# Patient Record
Sex: Female | Born: 1950 | Race: White | Hispanic: No | Marital: Married | Smoking: Never smoker
Health system: Southern US, Community
[De-identification: ages and names within clinical notes are randomized; demographics above are authoritative.]

## PROBLEM LIST (undated history)

## (undated) DIAGNOSIS — F419 Anxiety disorder, unspecified: Secondary | ICD-10-CM

## (undated) DIAGNOSIS — C801 Malignant (primary) neoplasm, unspecified: Secondary | ICD-10-CM

## (undated) DIAGNOSIS — E7800 Pure hypercholesterolemia, unspecified: Secondary | ICD-10-CM

## (undated) DIAGNOSIS — I10 Essential (primary) hypertension: Secondary | ICD-10-CM

## (undated) HISTORY — DX: Anxiety disorder, unspecified: F41.9

## (undated) HISTORY — PX: TRIGGER FINGER RELEASE: SHX641

## (undated) HISTORY — PX: ABDOMINAL HYSTERECTOMY: SHX81

## (undated) HISTORY — DX: Malignant (primary) neoplasm, unspecified: C80.1

---

## 1998-09-18 ENCOUNTER — Ambulatory Visit (HOSPITAL_COMMUNITY): Admission: RE | Admit: 1998-09-18 | Discharge: 1998-09-18 | Payer: Self-pay | Admitting: Obstetrics and Gynecology

## 1998-09-18 ENCOUNTER — Encounter: Payer: Self-pay | Admitting: Obstetrics and Gynecology

## 1998-10-06 ENCOUNTER — Other Ambulatory Visit: Admission: RE | Admit: 1998-10-06 | Discharge: 1998-10-06 | Payer: Self-pay | Admitting: Obstetrics and Gynecology

## 2000-01-31 ENCOUNTER — Encounter: Payer: Self-pay | Admitting: Obstetrics and Gynecology

## 2000-01-31 ENCOUNTER — Ambulatory Visit (HOSPITAL_COMMUNITY): Admission: RE | Admit: 2000-01-31 | Discharge: 2000-01-31 | Payer: Self-pay | Admitting: Obstetrics and Gynecology

## 2000-10-13 ENCOUNTER — Other Ambulatory Visit: Admission: RE | Admit: 2000-10-13 | Discharge: 2000-10-13 | Payer: Self-pay | Admitting: Obstetrics and Gynecology

## 2001-04-24 ENCOUNTER — Encounter: Payer: Self-pay | Admitting: Obstetrics and Gynecology

## 2001-04-24 ENCOUNTER — Ambulatory Visit (HOSPITAL_COMMUNITY): Admission: RE | Admit: 2001-04-24 | Discharge: 2001-04-24 | Payer: Self-pay | Admitting: Obstetrics and Gynecology

## 2001-05-07 ENCOUNTER — Encounter: Payer: Self-pay | Admitting: Obstetrics and Gynecology

## 2001-05-07 ENCOUNTER — Encounter: Admission: RE | Admit: 2001-05-07 | Discharge: 2001-05-07 | Payer: Self-pay | Admitting: Obstetrics and Gynecology

## 2002-08-13 ENCOUNTER — Encounter: Payer: Self-pay | Admitting: Obstetrics and Gynecology

## 2002-08-13 ENCOUNTER — Encounter: Admission: RE | Admit: 2002-08-13 | Discharge: 2002-08-13 | Payer: Self-pay | Admitting: Obstetrics and Gynecology

## 2003-08-18 ENCOUNTER — Encounter: Payer: Self-pay | Admitting: Obstetrics and Gynecology

## 2003-08-18 ENCOUNTER — Encounter: Admission: RE | Admit: 2003-08-18 | Discharge: 2003-08-18 | Payer: Self-pay | Admitting: Obstetrics and Gynecology

## 2004-09-17 ENCOUNTER — Ambulatory Visit: Payer: Self-pay | Admitting: Family Medicine

## 2004-09-21 ENCOUNTER — Ambulatory Visit: Payer: Self-pay | Admitting: Family Medicine

## 2004-09-24 ENCOUNTER — Encounter: Admission: RE | Admit: 2004-09-24 | Discharge: 2004-09-24 | Payer: Self-pay | Admitting: Obstetrics and Gynecology

## 2004-10-19 ENCOUNTER — Ambulatory Visit: Payer: Self-pay | Admitting: Family Medicine

## 2005-06-14 ENCOUNTER — Ambulatory Visit: Payer: Self-pay | Admitting: Family Medicine

## 2005-10-07 ENCOUNTER — Encounter: Admission: RE | Admit: 2005-10-07 | Discharge: 2005-10-07 | Payer: Self-pay | Admitting: Obstetrics and Gynecology

## 2005-10-14 ENCOUNTER — Ambulatory Visit: Payer: Self-pay | Admitting: Family Medicine

## 2005-10-28 ENCOUNTER — Ambulatory Visit: Payer: Self-pay | Admitting: Family Medicine

## 2005-11-25 ENCOUNTER — Encounter: Admission: RE | Admit: 2005-11-25 | Discharge: 2005-11-25 | Payer: Self-pay | Admitting: Obstetrics and Gynecology

## 2006-07-21 ENCOUNTER — Ambulatory Visit: Payer: Self-pay | Admitting: Family Medicine

## 2007-01-12 ENCOUNTER — Ambulatory Visit: Payer: Self-pay | Admitting: Family Medicine

## 2007-01-12 LAB — CONVERTED CEMR LAB
ALT: 23 units/L (ref 0–40)
AST: 26 units/L (ref 0–37)
Alkaline Phosphatase: 27 units/L — ABNORMAL LOW (ref 39–117)
BUN: 17 mg/dL (ref 6–23)
Bilirubin, Direct: 0.2 mg/dL (ref 0.0–0.3)
CO2: 27 meq/L (ref 19–32)
Calcium: 9.7 mg/dL (ref 8.4–10.5)
Chloride: 105 meq/L (ref 96–112)
GFR calc Af Amer: 134 mL/min
Glucose, Bld: 111 mg/dL — ABNORMAL HIGH (ref 70–99)
Total Protein: 6.7 g/dL (ref 6.0–8.3)

## 2007-01-15 ENCOUNTER — Encounter: Payer: Self-pay | Admitting: Family Medicine

## 2007-02-09 ENCOUNTER — Encounter: Admission: RE | Admit: 2007-02-09 | Discharge: 2007-05-10 | Payer: Self-pay | Admitting: Family Medicine

## 2007-03-23 ENCOUNTER — Encounter: Admission: RE | Admit: 2007-03-23 | Discharge: 2007-03-23 | Payer: Self-pay | Admitting: Obstetrics and Gynecology

## 2007-10-19 ENCOUNTER — Telehealth (INDEPENDENT_AMBULATORY_CARE_PROVIDER_SITE_OTHER): Payer: Self-pay | Admitting: *Deleted

## 2007-12-12 ENCOUNTER — Telehealth (INDEPENDENT_AMBULATORY_CARE_PROVIDER_SITE_OTHER): Payer: Self-pay | Admitting: *Deleted

## 2007-12-14 ENCOUNTER — Ambulatory Visit: Payer: Self-pay | Admitting: Family Medicine

## 2007-12-18 ENCOUNTER — Encounter: Payer: Self-pay | Admitting: Family Medicine

## 2007-12-23 LAB — CONVERTED CEMR LAB
ALT: 28 units/L (ref 0–35)
Alkaline Phosphatase: 32 units/L — ABNORMAL LOW (ref 39–117)
BUN: 12 mg/dL (ref 6–23)
Bilirubin, Direct: 0.2 mg/dL (ref 0.0–0.3)
CO2: 30 meq/L (ref 19–32)
Calcium: 9.5 mg/dL (ref 8.4–10.5)
Creatinine, Ser: 0.8 mg/dL (ref 0.4–1.2)
GFR calc Af Amer: 96 mL/min
Glucose, Bld: 108 mg/dL — ABNORMAL HIGH (ref 70–99)
Potassium: 3.8 meq/L (ref 3.5–5.1)
Total Bilirubin: 1.3 mg/dL — ABNORMAL HIGH (ref 0.3–1.2)
Total Protein: 6.9 g/dL (ref 6.0–8.3)

## 2007-12-26 ENCOUNTER — Telehealth (INDEPENDENT_AMBULATORY_CARE_PROVIDER_SITE_OTHER): Payer: Self-pay | Admitting: *Deleted

## 2008-01-28 ENCOUNTER — Telehealth (INDEPENDENT_AMBULATORY_CARE_PROVIDER_SITE_OTHER): Payer: Self-pay | Admitting: *Deleted

## 2008-08-15 ENCOUNTER — Encounter: Admission: RE | Admit: 2008-08-15 | Discharge: 2008-08-15 | Payer: Self-pay | Admitting: Family Medicine

## 2008-08-25 ENCOUNTER — Ambulatory Visit: Payer: Self-pay | Admitting: Gastroenterology

## 2008-09-08 ENCOUNTER — Ambulatory Visit: Payer: Self-pay | Admitting: Gastroenterology

## 2009-10-09 ENCOUNTER — Encounter: Admission: RE | Admit: 2009-10-09 | Discharge: 2009-10-09 | Payer: Self-pay | Admitting: Family Medicine

## 2010-10-25 ENCOUNTER — Encounter
Admission: RE | Admit: 2010-10-25 | Discharge: 2010-10-25 | Payer: Self-pay | Source: Home / Self Care | Attending: Family Medicine | Admitting: Family Medicine

## 2011-07-22 ENCOUNTER — Other Ambulatory Visit: Payer: Self-pay | Admitting: Dermatology

## 2011-10-03 ENCOUNTER — Other Ambulatory Visit: Payer: Self-pay | Admitting: Family Medicine

## 2011-10-03 DIAGNOSIS — Z1231 Encounter for screening mammogram for malignant neoplasm of breast: Secondary | ICD-10-CM

## 2011-10-27 ENCOUNTER — Ambulatory Visit
Admission: RE | Admit: 2011-10-27 | Discharge: 2011-10-27 | Disposition: A | Discharge status: Home / Self Care | Payer: 59 | Source: Ambulatory Visit | Attending: Family Medicine | Admitting: Family Medicine

## 2011-10-27 DIAGNOSIS — Z1231 Encounter for screening mammogram for malignant neoplasm of breast: Secondary | ICD-10-CM

## 2013-01-04 ENCOUNTER — Other Ambulatory Visit: Payer: Self-pay

## 2013-01-04 DIAGNOSIS — Z1231 Encounter for screening mammogram for malignant neoplasm of breast: Secondary | ICD-10-CM

## 2013-02-01 ENCOUNTER — Ambulatory Visit: Payer: 59

## 2013-03-04 ENCOUNTER — Ambulatory Visit
Admission: RE | Admit: 2013-03-04 | Discharge: 2013-03-04 | Disposition: A | Discharge status: Home / Self Care | Payer: BC Managed Care – PPO | Source: Ambulatory Visit

## 2013-03-04 DIAGNOSIS — Z1231 Encounter for screening mammogram for malignant neoplasm of breast: Secondary | ICD-10-CM

## 2013-06-27 ENCOUNTER — Encounter: Payer: Self-pay | Admitting: Gastroenterology

## 2013-08-16 ENCOUNTER — Encounter: Payer: Self-pay | Admitting: Gastroenterology

## 2013-10-24 ENCOUNTER — Encounter: Payer: BC Managed Care – PPO | Admitting: Gastroenterology

## 2014-02-21 ENCOUNTER — Other Ambulatory Visit: Payer: Self-pay

## 2014-02-21 DIAGNOSIS — Z1231 Encounter for screening mammogram for malignant neoplasm of breast: Secondary | ICD-10-CM

## 2014-03-10 ENCOUNTER — Ambulatory Visit: Payer: BC Managed Care – PPO

## 2014-03-14 ENCOUNTER — Ambulatory Visit
Admission: RE | Admit: 2014-03-14 | Discharge: 2014-03-14 | Disposition: A | Discharge status: Home / Self Care | Payer: BC Managed Care – PPO | Source: Ambulatory Visit

## 2014-03-14 DIAGNOSIS — Z1231 Encounter for screening mammogram for malignant neoplasm of breast: Secondary | ICD-10-CM

## 2014-04-05 ENCOUNTER — Encounter: Payer: Self-pay | Admitting: Gastroenterology

## 2015-02-24 ENCOUNTER — Other Ambulatory Visit: Payer: Self-pay

## 2015-02-24 DIAGNOSIS — Z1231 Encounter for screening mammogram for malignant neoplasm of breast: Secondary | ICD-10-CM

## 2015-03-16 ENCOUNTER — Ambulatory Visit: Payer: Self-pay

## 2015-04-24 ENCOUNTER — Encounter: Payer: Self-pay | Admitting: Gastroenterology

## 2015-05-29 ENCOUNTER — Ambulatory Visit
Admission: RE | Admit: 2015-05-29 | Discharge: 2015-05-29 | Disposition: A | Discharge status: Home / Self Care | Payer: BLUE CROSS/BLUE SHIELD | Source: Ambulatory Visit

## 2015-05-29 DIAGNOSIS — Z1231 Encounter for screening mammogram for malignant neoplasm of breast: Secondary | ICD-10-CM

## 2016-08-31 ENCOUNTER — Other Ambulatory Visit: Payer: Self-pay | Admitting: Family Medicine

## 2016-08-31 DIAGNOSIS — Z1231 Encounter for screening mammogram for malignant neoplasm of breast: Secondary | ICD-10-CM

## 2016-09-23 ENCOUNTER — Ambulatory Visit
Admission: RE | Admit: 2016-09-23 | Discharge: 2016-09-23 | Disposition: A | Discharge status: Home / Self Care | Payer: 59 | Source: Ambulatory Visit | Attending: Family Medicine | Admitting: Family Medicine

## 2016-09-23 DIAGNOSIS — Z1231 Encounter for screening mammogram for malignant neoplasm of breast: Secondary | ICD-10-CM

## 2017-01-23 DIAGNOSIS — D225 Melanocytic nevi of trunk: Secondary | ICD-10-CM | POA: Diagnosis not present

## 2017-01-23 DIAGNOSIS — D2372 Other benign neoplasm of skin of left lower limb, including hip: Secondary | ICD-10-CM | POA: Diagnosis not present

## 2017-01-23 DIAGNOSIS — Z86008 Personal history of in-situ neoplasm of other site: Secondary | ICD-10-CM | POA: Diagnosis not present

## 2017-01-23 DIAGNOSIS — L905 Scar conditions and fibrosis of skin: Secondary | ICD-10-CM | POA: Diagnosis not present

## 2017-01-23 DIAGNOSIS — Z23 Encounter for immunization: Secondary | ICD-10-CM | POA: Diagnosis not present

## 2017-01-23 DIAGNOSIS — D2272 Melanocytic nevi of left lower limb, including hip: Secondary | ICD-10-CM | POA: Diagnosis not present

## 2017-01-23 DIAGNOSIS — D224 Melanocytic nevi of scalp and neck: Secondary | ICD-10-CM | POA: Diagnosis not present

## 2017-01-23 DIAGNOSIS — Z86018 Personal history of other benign neoplasm: Secondary | ICD-10-CM | POA: Diagnosis not present

## 2017-01-23 DIAGNOSIS — L821 Other seborrheic keratosis: Secondary | ICD-10-CM | POA: Diagnosis not present

## 2017-01-23 DIAGNOSIS — L814 Other melanin hyperpigmentation: Secondary | ICD-10-CM | POA: Diagnosis not present

## 2017-02-24 DIAGNOSIS — D3132 Benign neoplasm of left choroid: Secondary | ICD-10-CM | POA: Diagnosis not present

## 2017-02-24 DIAGNOSIS — H40013 Open angle with borderline findings, low risk, bilateral: Secondary | ICD-10-CM | POA: Diagnosis not present

## 2017-07-11 DIAGNOSIS — R7309 Other abnormal glucose: Secondary | ICD-10-CM | POA: Diagnosis not present

## 2017-07-11 DIAGNOSIS — Z8679 Personal history of other diseases of the circulatory system: Secondary | ICD-10-CM | POA: Diagnosis not present

## 2017-07-11 DIAGNOSIS — E782 Mixed hyperlipidemia: Secondary | ICD-10-CM | POA: Diagnosis not present

## 2017-07-11 DIAGNOSIS — R0789 Other chest pain: Secondary | ICD-10-CM | POA: Diagnosis not present

## 2017-07-11 DIAGNOSIS — Z8639 Personal history of other endocrine, nutritional and metabolic disease: Secondary | ICD-10-CM | POA: Diagnosis not present

## 2017-07-14 DIAGNOSIS — D225 Melanocytic nevi of trunk: Secondary | ICD-10-CM | POA: Diagnosis not present

## 2017-07-14 DIAGNOSIS — R0789 Other chest pain: Secondary | ICD-10-CM | POA: Diagnosis not present

## 2017-07-14 DIAGNOSIS — E782 Mixed hyperlipidemia: Secondary | ICD-10-CM | POA: Diagnosis not present

## 2017-07-14 DIAGNOSIS — L814 Other melanin hyperpigmentation: Secondary | ICD-10-CM | POA: Diagnosis not present

## 2017-07-14 DIAGNOSIS — D2372 Other benign neoplasm of skin of left lower limb, including hip: Secondary | ICD-10-CM | POA: Diagnosis not present

## 2017-07-14 DIAGNOSIS — I10 Essential (primary) hypertension: Secondary | ICD-10-CM | POA: Diagnosis not present

## 2017-07-14 DIAGNOSIS — D2272 Melanocytic nevi of left lower limb, including hip: Secondary | ICD-10-CM | POA: Diagnosis not present

## 2017-07-14 DIAGNOSIS — D224 Melanocytic nevi of scalp and neck: Secondary | ICD-10-CM | POA: Diagnosis not present

## 2017-07-14 DIAGNOSIS — Z86018 Personal history of other benign neoplasm: Secondary | ICD-10-CM | POA: Diagnosis not present

## 2017-07-14 DIAGNOSIS — R7309 Other abnormal glucose: Secondary | ICD-10-CM | POA: Diagnosis not present

## 2017-07-14 DIAGNOSIS — Z23 Encounter for immunization: Secondary | ICD-10-CM | POA: Diagnosis not present

## 2017-07-14 DIAGNOSIS — L821 Other seborrheic keratosis: Secondary | ICD-10-CM | POA: Diagnosis not present

## 2017-07-14 DIAGNOSIS — B359 Dermatophytosis, unspecified: Secondary | ICD-10-CM | POA: Diagnosis not present

## 2017-07-14 DIAGNOSIS — Z86008 Personal history of in-situ neoplasm of other site: Secondary | ICD-10-CM | POA: Diagnosis not present

## 2017-08-07 DIAGNOSIS — Z23 Encounter for immunization: Secondary | ICD-10-CM | POA: Diagnosis not present

## 2017-08-14 DIAGNOSIS — E782 Mixed hyperlipidemia: Secondary | ICD-10-CM | POA: Diagnosis not present

## 2017-08-14 DIAGNOSIS — I10 Essential (primary) hypertension: Secondary | ICD-10-CM | POA: Diagnosis not present

## 2017-08-14 DIAGNOSIS — R0789 Other chest pain: Secondary | ICD-10-CM | POA: Diagnosis not present

## 2017-08-21 ENCOUNTER — Other Ambulatory Visit: Payer: Self-pay | Admitting: Family Medicine

## 2017-08-21 DIAGNOSIS — Z1231 Encounter for screening mammogram for malignant neoplasm of breast: Secondary | ICD-10-CM

## 2017-08-25 DIAGNOSIS — F419 Anxiety disorder, unspecified: Secondary | ICD-10-CM | POA: Diagnosis not present

## 2017-09-25 ENCOUNTER — Ambulatory Visit
Admission: RE | Admit: 2017-09-25 | Discharge: 2017-09-25 | Disposition: A | Discharge status: Home / Self Care | Payer: PPO | Source: Ambulatory Visit | Attending: Family Medicine | Admitting: Family Medicine

## 2017-09-25 ENCOUNTER — Encounter (INDEPENDENT_AMBULATORY_CARE_PROVIDER_SITE_OTHER): Payer: Self-pay

## 2017-09-25 DIAGNOSIS — Z1231 Encounter for screening mammogram for malignant neoplasm of breast: Secondary | ICD-10-CM | POA: Diagnosis not present

## 2017-10-02 DIAGNOSIS — J069 Acute upper respiratory infection, unspecified: Secondary | ICD-10-CM | POA: Diagnosis not present

## 2017-10-02 DIAGNOSIS — R05 Cough: Secondary | ICD-10-CM | POA: Diagnosis not present

## 2017-10-02 DIAGNOSIS — J040 Acute laryngitis: Secondary | ICD-10-CM | POA: Diagnosis not present

## 2017-12-26 ENCOUNTER — Emergency Department (HOSPITAL_BASED_OUTPATIENT_CLINIC_OR_DEPARTMENT_OTHER): Payer: PPO

## 2017-12-26 ENCOUNTER — Encounter (HOSPITAL_BASED_OUTPATIENT_CLINIC_OR_DEPARTMENT_OTHER): Payer: Self-pay

## 2017-12-26 ENCOUNTER — Other Ambulatory Visit: Payer: Self-pay

## 2017-12-26 ENCOUNTER — Emergency Department (HOSPITAL_BASED_OUTPATIENT_CLINIC_OR_DEPARTMENT_OTHER)
Admission: EM | Admit: 2017-12-26 | Discharge: 2017-12-26 | Disposition: A | Discharge status: Home / Self Care | Payer: PPO | Attending: Emergency Medicine | Admitting: Emergency Medicine

## 2017-12-26 DIAGNOSIS — W010XXA Fall on same level from slipping, tripping and stumbling without subsequent striking against object, initial encounter: Secondary | ICD-10-CM | POA: Diagnosis not present

## 2017-12-26 DIAGNOSIS — Y9389 Activity, other specified: Secondary | ICD-10-CM | POA: Insufficient documentation

## 2017-12-26 DIAGNOSIS — Z7982 Long term (current) use of aspirin: Secondary | ICD-10-CM | POA: Insufficient documentation

## 2017-12-26 DIAGNOSIS — S20211A Contusion of right front wall of thorax, initial encounter: Secondary | ICD-10-CM | POA: Diagnosis not present

## 2017-12-26 DIAGNOSIS — I10 Essential (primary) hypertension: Secondary | ICD-10-CM | POA: Insufficient documentation

## 2017-12-26 DIAGNOSIS — Y998 Other external cause status: Secondary | ICD-10-CM | POA: Diagnosis not present

## 2017-12-26 DIAGNOSIS — Y929 Unspecified place or not applicable: Secondary | ICD-10-CM | POA: Diagnosis not present

## 2017-12-26 DIAGNOSIS — R0781 Pleurodynia: Secondary | ICD-10-CM | POA: Diagnosis not present

## 2017-12-26 DIAGNOSIS — Z79899 Other long term (current) drug therapy: Secondary | ICD-10-CM | POA: Insufficient documentation

## 2017-12-26 DIAGNOSIS — S20221A Contusion of right back wall of thorax, initial encounter: Secondary | ICD-10-CM | POA: Diagnosis not present

## 2017-12-26 DIAGNOSIS — S299XXA Unspecified injury of thorax, initial encounter: Secondary | ICD-10-CM | POA: Diagnosis not present

## 2017-12-26 HISTORY — DX: Pure hypercholesterolemia, unspecified: E78.00

## 2017-12-26 HISTORY — DX: Essential (primary) hypertension: I10

## 2017-12-26 MED ORDER — IBUPROFEN 400 MG PO TABS: 400.0000 mg | ORAL_TABLET | Freq: Once | ORAL | Status: DC

## 2017-12-26 MED ORDER — TRAMADOL HCL 50 MG PO TABS: 50.0000 mg | ORAL_TABLET | Freq: Four times a day (QID) | ORAL | 0 refills | Status: DC | PRN

## 2017-12-26 MED ORDER — METHOCARBAMOL 750 MG PO TABS: 750.0000 mg | ORAL_TABLET | Freq: Three times a day (TID) | ORAL | 0 refills | Status: DC | PRN

## 2017-12-26 MED FILL — traMADol HCL 50 MG TABS: 50 | 3 days supply | Qty: 15 | Fill #0

## 2017-12-26 MED FILL — METHOCARBAMOL 750 MG TABLET: 750 | 5 days supply | Qty: 15 | Fill #0

## 2017-12-26 NOTE — ED Triage Notes (Signed)
Pt states she fell playing pickle ball 915am-c/o pain to right fingers, right elbow and right rib area-NAD-steady gait-pt requests rib only xray at this time

## 2017-12-26 NOTE — ED Notes (Signed)
ED Provider at bedside. 

## 2017-12-26 NOTE — ED Provider Notes (Signed)
[facility] EMERGENCY DEPARTMENT Provider Note   CSN: [id number] Arrival date & time: 12/26/17  1045     History   Chief Complaint Chief Complaint  Patient presents with  . Fall    HPI Julia Scott is a 67 y.o. female.  Patient c/o trip and fall while playing pickleball this AM, hit right posterior ribs on post. Constant, dull, moderate pain to area. Worse w certain movements and palpation area.  No sob. No abd pain. No nv. Denies other pain/injury. No head injury or headache. No neck or back pain.     Fall  Pertinent negatives include no headaches and no shortness of breath.    Past Medical History:  Diagnosis Date  . High cholesterol   . Hypertension     There are no active problems to display for this patient.   Past Surgical History:  Procedure Laterality Date  . ABDOMINAL HYSTERECTOMY      OB History    No data available       Home Medications    Prior to Admission medications   Medication Sig Start Date End Date Taking? Authorizing Provider  aspirin 81 MG chewable tablet Chew by mouth daily.   Yes [provider]  atenolol (TENORMIN) 50 MG tablet Take 50 mg by mouth daily.   Yes [provider]  fenofibrate 160 MG tablet Take 160 mg by mouth daily.   Yes [provider]  rosuvastatin (CRESTOR) 10 MG tablet Take 10 mg by mouth daily.   Yes [provider]    Family History No family history on file.  Social History Social History   Tobacco Use  . Smoking status: Never Smoker  . Smokeless tobacco: Never Used  Substance Use Topics  . Alcohol use: Yes    Comment: occ  . Drug use: No     Allergies   Patient has no known allergies.   Review of Systems Review of Systems  Constitutional: Negative for fever.  Respiratory: Negative for shortness of breath.   Musculoskeletal: Negative for back pain and neck pain.  Skin: Negative for wound.  Neurological: Negative for headaches.      Physical Exam Updated Vital Signs BP (!) 117/91 (BP Location: Left Arm)   Pulse (!) 54   Temp 98.5 F (36.9 C) (Oral)   Resp 20   Ht 1.727 m (5\' 8" )   Wt 84.4 kg (186 lb 1.1 oz)   SpO2 98%   BMI 28.29 kg/m   Physical Exam  Constitutional: She appears well-developed and well-nourished. No distress.  HENT:  Head: Atraumatic.  Eyes: Conjunctivae are normal. No scleral icterus.  Neck: Neck supple. No tracheal deviation present.  Pulmonary/Chest: Effort normal and breath sounds normal. No respiratory distress. She exhibits tenderness.  Right posterior/lat chest wall tenderness. Normal chest wall movement. No crepitus.   Abdominal: Soft. Normal appearance. She exhibits no distension. There is no tenderness.  Musculoskeletal: She exhibits no edema.  CTLS spine, non tender, aligned, no step off.   Neurological: She is alert.  Speech normal. Ambulates w steady gait.   Skin: Skin is warm and dry. No rash noted. She is not diaphoretic.  Psychiatric: She has a normal mood and affect.  Nursing note and vitals reviewed.    ED Treatments / Results  Labs (all labs ordered are listed, but only abnormal results are displayed) Labs Reviewed - No data to display  EKG  EKG Interpretation None       Radiology  Dg Ribs Unilateral W/chest Right  Result Date: 12/26/2017 CLINICAL DATA:  Fall.  Right rib pain EXAM: RIGHT RIBS AND CHEST - 3+ VIEW COMPARISON:  None. FINDINGS: No appreciable pneumothorax or pleural effusion. Thoracic spondylosis. Atherosclerotic calcification of the aortic arch. Calcification adjacent to the right humeral head. No pulmonary contusion. The lower ribs in the area of the patient's point of discomfort are indistinct. I do not see an obvious cortical discontinuity. IMPRESSION: 1. No appreciable displaced fracture. Nondisplaced rib fractures are often occult on conventional radiography. 2. No pneumothorax, pleural effusion, or pulmonary contusion. 3.  Aortic  Atherosclerosis (ICD10-I70.0). Electronically Signed   By: Van Clines M.D.   On: 12/26/2017 11:39    Procedures Procedures (including critical care time)  Medications Ordered in ED Medications  ibuprofen (ADVIL,MOTRIN) tablet 400 mg (not administered)     Initial Impression / Assessment and Plan / ED Course  I have reviewed the triage vital signs and the nursing notes.  Pertinent labs & imaging results that were available during my care of the patient were reviewed by me and considered in my medical decision making (see chart for details).  Xrays.  Motrin po.  xrays reviewed, no fracture.   Discussed xrays w pt and diff dx including bruised ribs, non displaced fx.   Pt request muscle relaxer rx for home.   Final Clinical Impressions(s) / ED Diagnoses   Final diagnoses:  None    ED Discharge Orders    None       Lajean Saver, MD 12/26/17 1329

## 2017-12-26 NOTE — Discharge Instructions (Signed)
It was our pleasure to provide your ER care today - we hope that you feel better.  Take motrin or aleve as need for pain.  You may also try ultram as need for pain, and/or robaxin as need for muscle spasm - no driving when taking these medications.  Return to ER if worse, new symptoms, trouble breathing, other concern.

## 2017-12-27 ENCOUNTER — Other Ambulatory Visit: Payer: Self-pay

## 2018-01-30 DIAGNOSIS — J40 Bronchitis, not specified as acute or chronic: Secondary | ICD-10-CM | POA: Diagnosis not present

## 2018-02-09 DIAGNOSIS — E782 Mixed hyperlipidemia: Secondary | ICD-10-CM | POA: Diagnosis not present

## 2018-02-09 DIAGNOSIS — F419 Anxiety disorder, unspecified: Secondary | ICD-10-CM | POA: Diagnosis not present

## 2018-02-09 DIAGNOSIS — I10 Essential (primary) hypertension: Secondary | ICD-10-CM | POA: Diagnosis not present

## 2018-05-31 DIAGNOSIS — M65342 Trigger finger, left ring finger: Secondary | ICD-10-CM | POA: Diagnosis not present

## 2018-05-31 DIAGNOSIS — M65332 Trigger finger, left middle finger: Secondary | ICD-10-CM | POA: Diagnosis not present

## 2018-05-31 DIAGNOSIS — M65341 Trigger finger, right ring finger: Secondary | ICD-10-CM | POA: Diagnosis not present

## 2018-06-22 DIAGNOSIS — M65342 Trigger finger, left ring finger: Secondary | ICD-10-CM | POA: Diagnosis not present

## 2018-06-22 DIAGNOSIS — M65332 Trigger finger, left middle finger: Secondary | ICD-10-CM | POA: Diagnosis not present

## 2018-07-16 DIAGNOSIS — H01022 Squamous blepharitis right lower eyelid: Secondary | ICD-10-CM | POA: Diagnosis not present

## 2018-07-16 DIAGNOSIS — H2513 Age-related nuclear cataract, bilateral: Secondary | ICD-10-CM | POA: Diagnosis not present

## 2018-07-16 DIAGNOSIS — H01024 Squamous blepharitis left upper eyelid: Secondary | ICD-10-CM | POA: Diagnosis not present

## 2018-07-16 DIAGNOSIS — H01021 Squamous blepharitis right upper eyelid: Secondary | ICD-10-CM | POA: Diagnosis not present

## 2018-07-16 DIAGNOSIS — H5212 Myopia, left eye: Secondary | ICD-10-CM | POA: Diagnosis not present

## 2018-07-16 DIAGNOSIS — H01025 Squamous blepharitis left lower eyelid: Secondary | ICD-10-CM | POA: Diagnosis not present

## 2018-07-16 DIAGNOSIS — H40013 Open angle with borderline findings, low risk, bilateral: Secondary | ICD-10-CM | POA: Diagnosis not present

## 2018-07-16 DIAGNOSIS — D3132 Benign neoplasm of left choroid: Secondary | ICD-10-CM | POA: Diagnosis not present

## 2018-07-19 DIAGNOSIS — Z86018 Personal history of other benign neoplasm: Secondary | ICD-10-CM | POA: Diagnosis not present

## 2018-07-19 DIAGNOSIS — D224 Melanocytic nevi of scalp and neck: Secondary | ICD-10-CM | POA: Diagnosis not present

## 2018-07-19 DIAGNOSIS — D485 Neoplasm of uncertain behavior of skin: Secondary | ICD-10-CM | POA: Diagnosis not present

## 2018-07-19 DIAGNOSIS — D2372 Other benign neoplasm of skin of left lower limb, including hip: Secondary | ICD-10-CM | POA: Diagnosis not present

## 2018-07-19 DIAGNOSIS — L814 Other melanin hyperpigmentation: Secondary | ICD-10-CM | POA: Diagnosis not present

## 2018-07-19 DIAGNOSIS — Z86008 Personal history of in-situ neoplasm of other site: Secondary | ICD-10-CM | POA: Diagnosis not present

## 2018-07-19 DIAGNOSIS — D2272 Melanocytic nevi of left lower limb, including hip: Secondary | ICD-10-CM | POA: Diagnosis not present

## 2018-07-19 DIAGNOSIS — D225 Melanocytic nevi of trunk: Secondary | ICD-10-CM | POA: Diagnosis not present

## 2018-09-11 ENCOUNTER — Encounter: Payer: Self-pay | Admitting: Gastroenterology

## 2018-10-11 ENCOUNTER — Encounter (INDEPENDENT_AMBULATORY_CARE_PROVIDER_SITE_OTHER): Payer: Self-pay

## 2018-10-11 ENCOUNTER — Encounter: Payer: Self-pay | Admitting: Gastroenterology

## 2018-10-11 ENCOUNTER — Ambulatory Visit (AMBULATORY_SURGERY_CENTER): Payer: Self-pay

## 2018-10-11 VITALS — Ht 68.0 in | Wt 197.6 lb

## 2018-10-11 DIAGNOSIS — Z1211 Encounter for screening for malignant neoplasm of colon: Secondary | ICD-10-CM

## 2018-10-11 MED ORDER — PEG 3350-KCL-NA BICARB-NACL 420 G PO SOLR: 4000.0000 mL | Freq: Once | ORAL | 0 refills | Status: AC

## 2018-10-11 NOTE — Progress Notes (Signed)
Denies allergies to eggs or soy products. Denies complication of anesthesia or sedation. Denies use of weight loss medication. Denies use of O2.   Emmi instructions declined.  

## 2018-10-18 DIAGNOSIS — F419 Anxiety disorder, unspecified: Secondary | ICD-10-CM | POA: Diagnosis not present

## 2018-10-18 DIAGNOSIS — E782 Mixed hyperlipidemia: Secondary | ICD-10-CM | POA: Diagnosis not present

## 2018-10-18 DIAGNOSIS — Z23 Encounter for immunization: Secondary | ICD-10-CM | POA: Diagnosis not present

## 2018-10-18 DIAGNOSIS — I10 Essential (primary) hypertension: Secondary | ICD-10-CM | POA: Diagnosis not present

## 2018-10-25 ENCOUNTER — Ambulatory Visit (AMBULATORY_SURGERY_CENTER): Payer: PPO | Admitting: Gastroenterology

## 2018-10-25 ENCOUNTER — Encounter: Payer: Self-pay | Admitting: Gastroenterology

## 2018-10-25 VITALS — BP 149/78 | HR 49 | Temp 97.3°F | Resp 14 | Ht 68.0 in | Wt 197.0 lb

## 2018-10-25 DIAGNOSIS — Z86010 Personal history of colonic polyps: Secondary | ICD-10-CM | POA: Diagnosis not present

## 2018-10-25 DIAGNOSIS — E7800 Pure hypercholesterolemia, unspecified: Secondary | ICD-10-CM | POA: Diagnosis not present

## 2018-10-25 DIAGNOSIS — I10 Essential (primary) hypertension: Secondary | ICD-10-CM | POA: Diagnosis not present

## 2018-10-25 MED ORDER — SODIUM CHLORIDE 0.9 % IV SOLN: 500.0000 mL | Freq: Once | INTRAVENOUS | Status: DC

## 2018-10-25 NOTE — Progress Notes (Signed)
Pt's states no medical or surgical changes since previsit or office visit. 

## 2018-10-25 NOTE — Patient Instructions (Signed)
Handouts given for high fiber diet and diverticulosis  YOU HAD AN ENDOSCOPIC PROCEDURE TODAY AT [facility]:   Refer to the procedure report that was given to you for any specific questions about what was found during the examination.  If the procedure report does not answer your questions, please call your gastroenterologist to clarify.  If you requested that your care partner not be given the details of your procedure findings, then the procedure report has been included in a sealed envelope for you to review at your convenience later.  YOU SHOULD EXPECT: Some feelings of bloating in the abdomen. Passage of more gas than usual.  Walking can help get rid of the air that was put into your GI tract during the procedure and reduce the bloating. If you had a lower endoscopy (such as a colonoscopy or flexible sigmoidoscopy) you may notice spotting of blood in your stool or on the toilet paper. If you underwent a bowel prep for your procedure, you may not have a normal bowel movement for a few days.  Please Note:  You might notice some irritation and congestion in your nose or some drainage.  This is from the oxygen used during your procedure.  There is no need for concern and it should clear up in a day or so.  SYMPTOMS TO REPORT IMMEDIATELY:   Following lower endoscopy (colonoscopy or flexible sigmoidoscopy):  Excessive amounts of blood in the stool  Significant tenderness or worsening of abdominal pains  Swelling of the abdomen that is new, acute  Fever of 100F or higher  For urgent or emergent issues, a gastroenterologist can be reached at any hour by calling [phone].   DIET:  We do recommend a small meal at first, but then you may proceed to your regular diet.  Drink plenty of fluids but you should avoid alcoholic beverages for 24 hours.  ACTIVITY:  You should plan to take it easy for the rest of today and you should NOT DRIVE or use heavy machinery until tomorrow  (because of the sedation medicines used during the test).    FOLLOW UP: Our staff will call the number listed on your records the next business day following your procedure to check on you and address any questions or concerns that you may have regarding the information given to you following your procedure. If we do not reach you, we will leave a message.  However, if you are feeling well and you are not experiencing any problems, there is no need to return our call.  We will assume that you have returned to your regular daily activities without incident.  If any biopsies were taken you will be contacted by phone or by letter within the next 1-3 weeks.  Please call us at [phone] if you have not heard about the biopsies in 3 weeks.    SIGNATURES/CONFIDENTIALITY: You and/or your care partner have signed paperwork which will be entered into your electronic medical record.  These signatures attest to the fact that that the information above on your After Visit Summary has been reviewed and is understood.  Full responsibility of the confidentiality of this discharge information lies with you and/or your care-partner.

## 2018-10-25 NOTE — Progress Notes (Signed)
Report given to PACU, vss 

## 2018-10-25 NOTE — Op Note (Signed)
[facility] Patient Name: Julia Scott Procedure Date: 10/25/2018 8:02 AM MRN: [id number] Endoscopist: Ladene Artist , MD Age: 67 Referring MD:  Date of Birth: 08-05-1951 Gender: Female Account #: [id number] Procedure:                Colonoscopy Indications:              Surveillance: Personal history of adenomatous                            polyps on last colonoscopy 5 years ago Medicines:                Monitored Anesthesia Care Procedure:                Pre-Anesthesia Assessment:                           - Prior to the procedure, a History and Physical                            was performed, and patient medications and                            allergies were reviewed. The patient's tolerance of                            previous anesthesia was also reviewed. The risks                            and benefits of the procedure and the sedation                            options and risks were discussed with the patient.                            All questions were answered, and informed consent                            was obtained. Prior Anticoagulants: The patient has                            taken no previous anticoagulant or antiplatelet                            agents. ASA Grade Assessment: II - A patient with                            mild systemic disease. After reviewing the risks                            and benefits, the patient was deemed in                            satisfactory condition to undergo the procedure.  After obtaining informed consent, the colonoscope                            was passed under direct vision. Throughout the                            procedure, the patient's blood pressure, pulse, and                            oxygen saturations were monitored continuously. The                            Colonoscope was introduced through the anus and                            advanced to the the  cecum, identified by                            appendiceal orifice and ileocecal valve. The                            ileocecal valve, appendiceal orifice, and rectum                            were photographed. The quality of the bowel                            preparation was adequate. The colonoscopy was                            performed without difficulty. The patient tolerated                            the procedure well. Scope In: 8:11:14 AM Scope Out: 8:25:26 AM Scope Withdrawal Time: 0 hours 11 minutes 46 seconds  Total Procedure Duration: 0 hours 14 minutes 12 seconds  Findings:                 The perianal and digital rectal examinations were                            normal.                           Multiple medium-mouthed diverticula were found in                            the entire colon. More concentrated in the left                            colon where there was evidence of diverticular                            spasm.  The exam was otherwise without abnormality on                            direct and retroflexion views. Complications:            No immediate complications. Estimated blood loss:                            None. Estimated Blood Loss:     Estimated blood loss: none. Impression:               - Moderate diverticulosis in the entire examined                            colon. There was evidence of diverticular spasm.                           - The examination was otherwise normal on direct                            and retroflexion views.                           - No specimens collected. Recommendation:           - Repeat colonoscopy in 10 years for screening                            purposes.                           - Patient has a contact number available for                            emergencies. The signs and symptoms of potential                            delayed complications were discussed with the                             patient. Return to normal activities tomorrow.                            Written discharge instructions were provided to the                            patient.                           - High fiber diet.                           - Continue present medications. Ladene Artist, MD 10/25/2018 8:29:36 AM This report has been signed electronically.

## 2018-10-26 ENCOUNTER — Telehealth: Payer: Self-pay

## 2018-10-26 NOTE — Telephone Encounter (Signed)
  Follow up Call-  Call back number 10/25/2018  Post procedure Call Back phone  # [phone]  Permission to leave phone message Yes  Some recent data might be hidden     Patient questions:  Do you have a fever, pain , or abdominal swelling? No. Pain Score  0 *  Have you tolerated food without any problems? Yes.    Have you been able to return to your normal activities? Yes.    Do you have any questions about your discharge instructions: Diet   No. Medications  No. Follow up visit  No.  Do you have questions or concerns about your Care? No.  Actions: * If pain score is 4 or above: No action needed, pain <4.

## 2018-11-01 DIAGNOSIS — J9801 Acute bronchospasm: Secondary | ICD-10-CM | POA: Diagnosis not present

## 2018-11-01 DIAGNOSIS — J209 Acute bronchitis, unspecified: Secondary | ICD-10-CM | POA: Diagnosis not present

## 2018-11-02 ENCOUNTER — Other Ambulatory Visit: Payer: Self-pay | Admitting: Family Medicine

## 2018-11-02 DIAGNOSIS — Z1231 Encounter for screening mammogram for malignant neoplasm of breast: Secondary | ICD-10-CM

## 2018-11-27 ENCOUNTER — Ambulatory Visit
Admission: RE | Admit: 2018-11-27 | Discharge: 2018-11-27 | Disposition: A | Discharge status: Home / Self Care | Payer: PPO | Source: Ambulatory Visit | Attending: Family Medicine | Admitting: Family Medicine

## 2018-11-27 DIAGNOSIS — Z1231 Encounter for screening mammogram for malignant neoplasm of breast: Secondary | ICD-10-CM

## 2019-05-02 DIAGNOSIS — B353 Tinea pedis: Secondary | ICD-10-CM | POA: Diagnosis not present

## 2019-05-02 DIAGNOSIS — L814 Other melanin hyperpigmentation: Secondary | ICD-10-CM | POA: Diagnosis not present

## 2019-05-02 DIAGNOSIS — D2272 Melanocytic nevi of left lower limb, including hip: Secondary | ICD-10-CM | POA: Diagnosis not present

## 2019-05-02 DIAGNOSIS — D224 Melanocytic nevi of scalp and neck: Secondary | ICD-10-CM | POA: Diagnosis not present

## 2019-05-02 DIAGNOSIS — Z86018 Personal history of other benign neoplasm: Secondary | ICD-10-CM | POA: Diagnosis not present

## 2019-05-02 DIAGNOSIS — L570 Actinic keratosis: Secondary | ICD-10-CM | POA: Diagnosis not present

## 2019-05-02 DIAGNOSIS — D2372 Other benign neoplasm of skin of left lower limb, including hip: Secondary | ICD-10-CM | POA: Diagnosis not present

## 2019-05-02 DIAGNOSIS — D225 Melanocytic nevi of trunk: Secondary | ICD-10-CM | POA: Diagnosis not present

## 2019-05-02 DIAGNOSIS — Z86008 Personal history of in-situ neoplasm of other site: Secondary | ICD-10-CM | POA: Diagnosis not present

## 2019-05-27 DIAGNOSIS — R110 Nausea: Secondary | ICD-10-CM | POA: Diagnosis not present

## 2019-05-27 DIAGNOSIS — R1013 Epigastric pain: Secondary | ICD-10-CM | POA: Diagnosis not present

## 2019-05-27 DIAGNOSIS — I10 Essential (primary) hypertension: Secondary | ICD-10-CM | POA: Diagnosis not present

## 2019-05-27 DIAGNOSIS — E782 Mixed hyperlipidemia: Secondary | ICD-10-CM | POA: Diagnosis not present

## 2019-09-05 DIAGNOSIS — M65342 Trigger finger, left ring finger: Secondary | ICD-10-CM | POA: Diagnosis not present

## 2019-09-05 DIAGNOSIS — M65341 Trigger finger, right ring finger: Secondary | ICD-10-CM | POA: Diagnosis not present

## 2019-09-05 DIAGNOSIS — M65332 Trigger finger, left middle finger: Secondary | ICD-10-CM | POA: Diagnosis not present

## 2019-09-05 DIAGNOSIS — M65312 Trigger thumb, left thumb: Secondary | ICD-10-CM | POA: Diagnosis not present

## 2019-09-18 DIAGNOSIS — M65312 Trigger thumb, left thumb: Secondary | ICD-10-CM | POA: Diagnosis not present

## 2019-12-03 ENCOUNTER — Ambulatory Visit: Payer: PPO

## 2019-12-04 DIAGNOSIS — E782 Mixed hyperlipidemia: Secondary | ICD-10-CM | POA: Diagnosis not present

## 2019-12-04 DIAGNOSIS — I10 Essential (primary) hypertension: Secondary | ICD-10-CM | POA: Diagnosis not present

## 2019-12-04 DIAGNOSIS — F419 Anxiety disorder, unspecified: Secondary | ICD-10-CM | POA: Diagnosis not present

## 2019-12-12 ENCOUNTER — Ambulatory Visit: Payer: PPO

## 2019-12-20 ENCOUNTER — Ambulatory Visit: Payer: PPO

## 2020-01-08 ENCOUNTER — Other Ambulatory Visit: Payer: Self-pay | Admitting: Family Medicine

## 2020-01-08 DIAGNOSIS — Z1231 Encounter for screening mammogram for malignant neoplasm of breast: Secondary | ICD-10-CM

## 2020-01-09 ENCOUNTER — Ambulatory Visit: Payer: PPO

## 2020-01-09 DIAGNOSIS — I10 Essential (primary) hypertension: Secondary | ICD-10-CM | POA: Diagnosis not present

## 2020-01-09 DIAGNOSIS — R7309 Other abnormal glucose: Secondary | ICD-10-CM | POA: Diagnosis not present

## 2020-01-09 DIAGNOSIS — Z79899 Other long term (current) drug therapy: Secondary | ICD-10-CM | POA: Diagnosis not present

## 2020-01-09 DIAGNOSIS — E782 Mixed hyperlipidemia: Secondary | ICD-10-CM | POA: Diagnosis not present

## 2020-01-09 DIAGNOSIS — Z130 Encounter for screening for diseases of the blood and blood-forming organs and certain disorders involving the immune mechanism: Secondary | ICD-10-CM | POA: Diagnosis not present

## 2020-01-09 DIAGNOSIS — Z13228 Encounter for screening for other metabolic disorders: Secondary | ICD-10-CM | POA: Diagnosis not present

## 2020-01-09 DIAGNOSIS — Z1329 Encounter for screening for other suspected endocrine disorder: Secondary | ICD-10-CM | POA: Diagnosis not present

## 2020-01-09 DIAGNOSIS — Z13220 Encounter for screening for lipoid disorders: Secondary | ICD-10-CM | POA: Diagnosis not present

## 2020-01-09 DIAGNOSIS — Z0000 Encounter for general adult medical examination without abnormal findings: Secondary | ICD-10-CM | POA: Diagnosis not present

## 2020-01-30 DIAGNOSIS — M545 Low back pain: Secondary | ICD-10-CM | POA: Diagnosis not present

## 2020-01-30 DIAGNOSIS — S0512XA Contusion of eyeball and orbital tissues, left eye, initial encounter: Secondary | ICD-10-CM | POA: Diagnosis not present

## 2020-02-24 ENCOUNTER — Ambulatory Visit
Admission: RE | Admit: 2020-02-24 | Discharge: 2020-02-24 | Disposition: A | Discharge status: Home / Self Care | Payer: PPO | Source: Ambulatory Visit | Attending: Family Medicine | Admitting: Family Medicine

## 2020-02-24 ENCOUNTER — Other Ambulatory Visit: Payer: Self-pay

## 2020-02-24 DIAGNOSIS — Z1231 Encounter for screening mammogram for malignant neoplasm of breast: Secondary | ICD-10-CM

## 2020-04-28 IMAGING — MG DIGITAL SCREENING BILAT W/ TOMO W/ CAD
8 series · 8 of 24 positions shown · non-contrast
Comparison: Previous exam(s).

CLINICAL DATA: Screening.

EXAM:
DIGITAL SCREENING BILATERAL MAMMOGRAM WITH TOMO AND CAD

[L CC synth-2D]
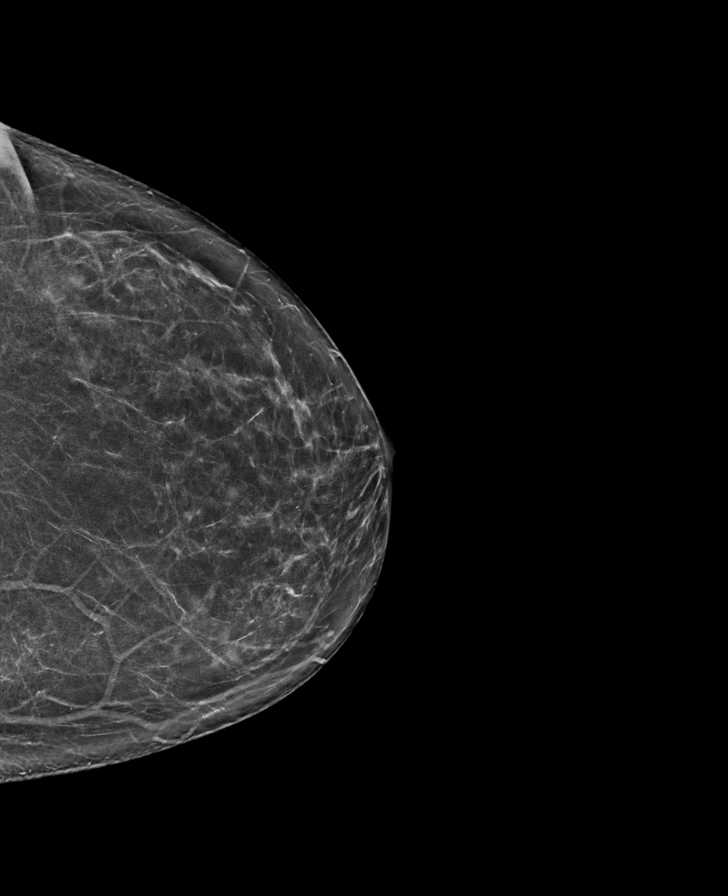

[R CC synth-2D]
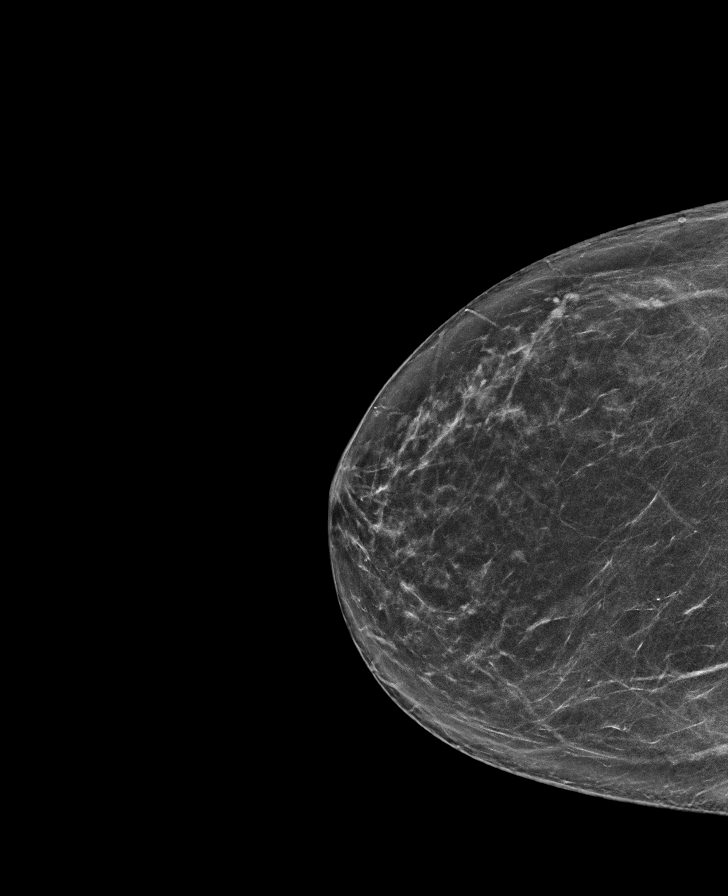

[L MLO synth-2D]
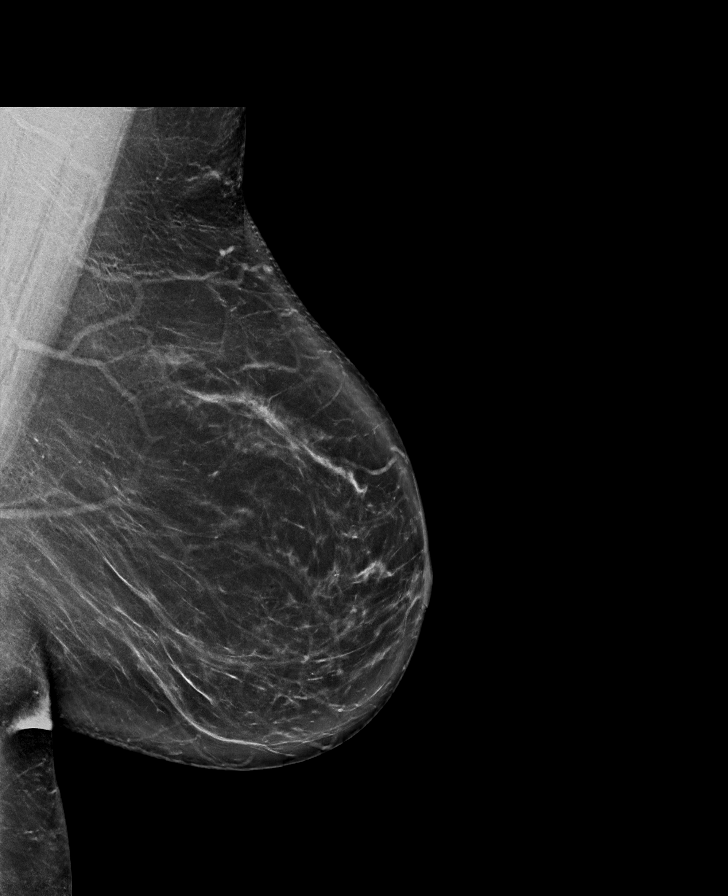

[R MLO synth-2D]
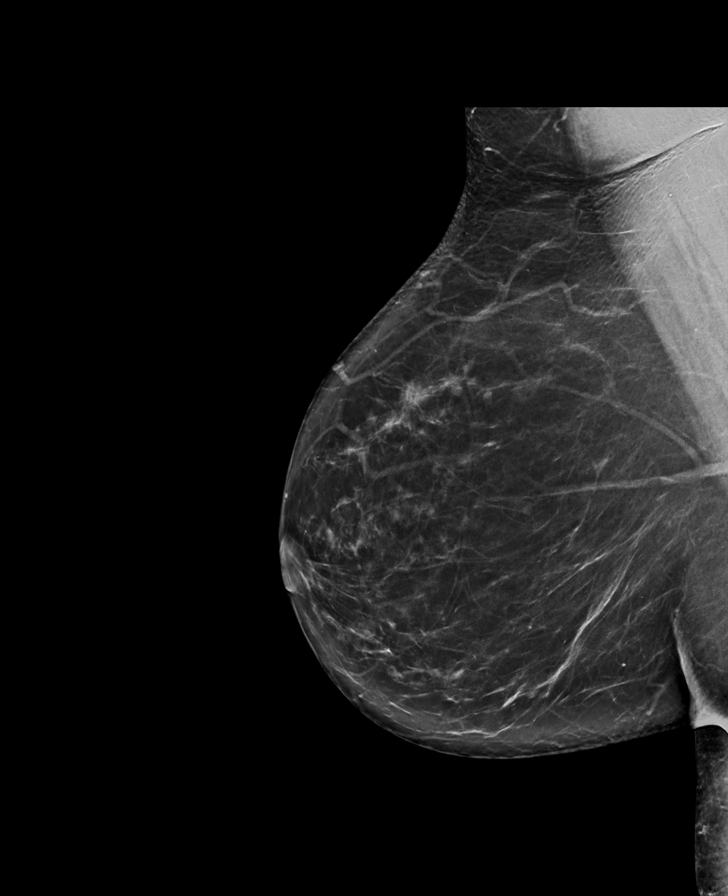

[L MLO tomo · tomo slice 45/89.0]
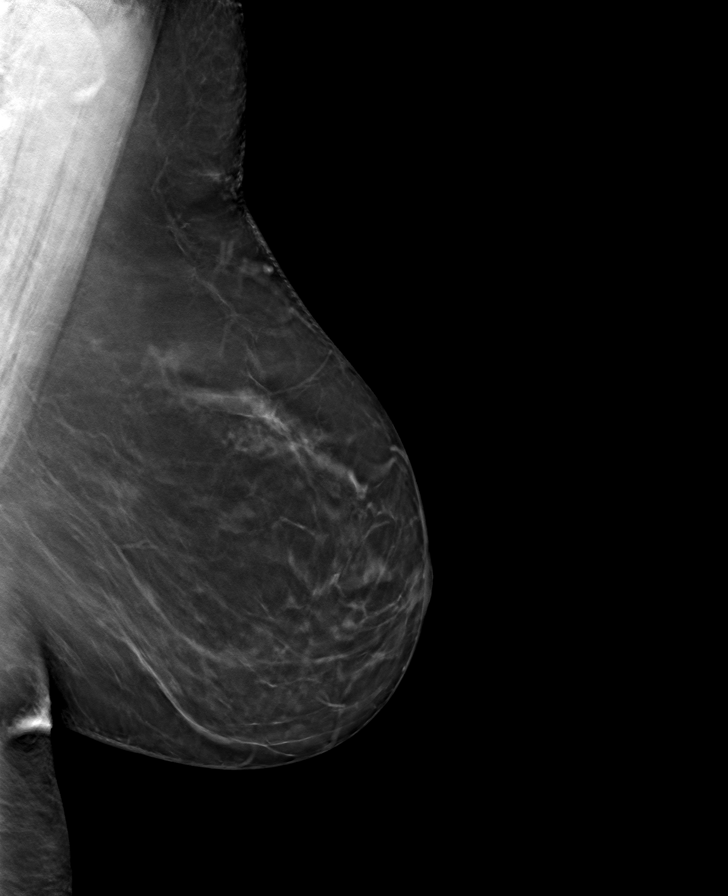

[R MLO tomo · tomo slice 43/84.0]
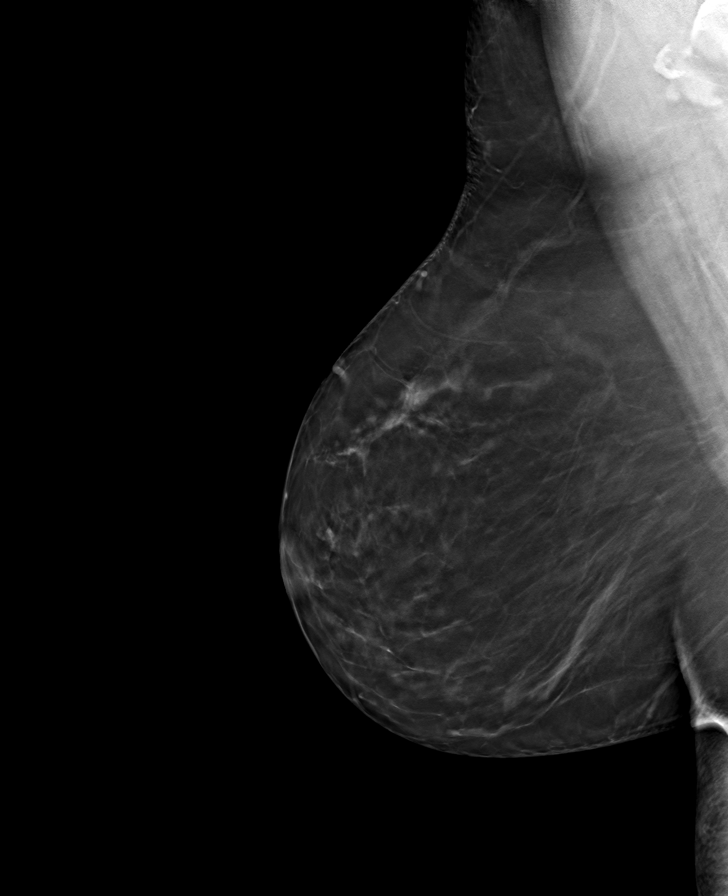

[L CC tomo · tomo slice 35/70.0]
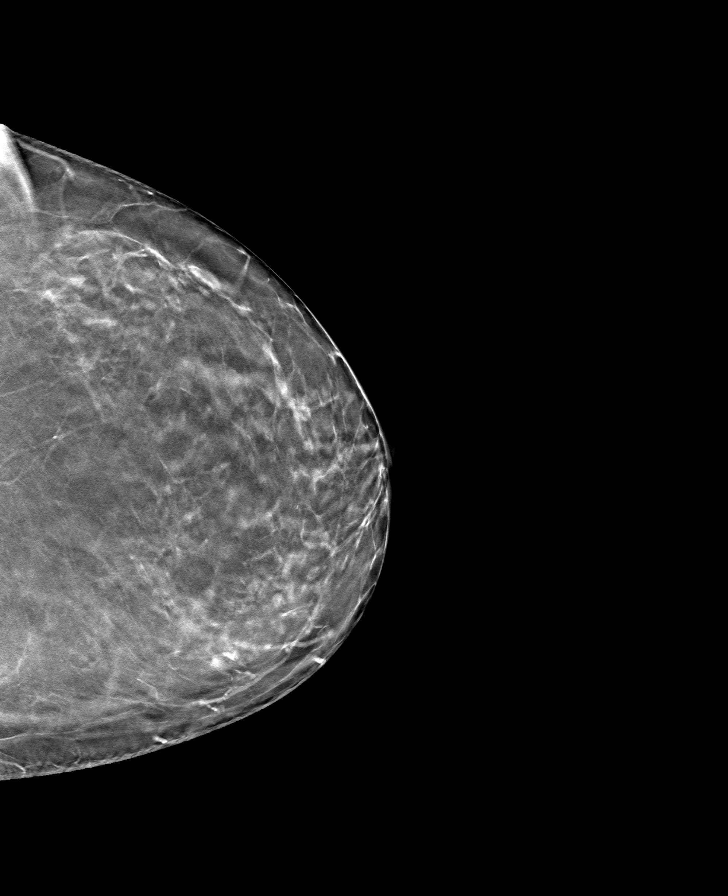

[R CC tomo · tomo slice 37/72.0]
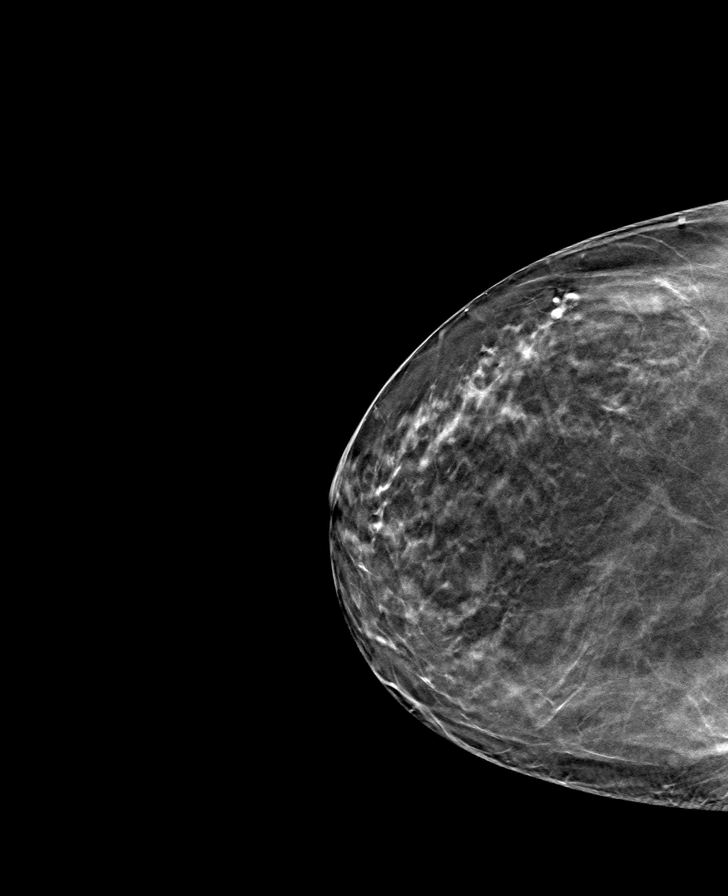

[8 of 24 positions shown; findings below may reference images not displayed]

ACR Breast Density Category b: There are scattered areas of
fibroglandular density.
FINDINGS: There are no findings suspicious for malignancy. Images were
processed with CAD.
IMPRESSION: No mammographic evidence of malignancy. A result letter of this
screening mammogram will be mailed directly to the patient.

RECOMMENDATION:
Screening mammogram in one year. (Code:[id number])

BI-RADS CATEGORY  1: Negative.

## 2020-06-05 DIAGNOSIS — Z86018 Personal history of other benign neoplasm: Secondary | ICD-10-CM | POA: Diagnosis not present

## 2020-06-05 DIAGNOSIS — L821 Other seborrheic keratosis: Secondary | ICD-10-CM | POA: Diagnosis not present

## 2020-06-05 DIAGNOSIS — D225 Melanocytic nevi of trunk: Secondary | ICD-10-CM | POA: Diagnosis not present

## 2020-06-05 DIAGNOSIS — L578 Other skin changes due to chronic exposure to nonionizing radiation: Secondary | ICD-10-CM | POA: Diagnosis not present

## 2020-06-05 DIAGNOSIS — B353 Tinea pedis: Secondary | ICD-10-CM | POA: Diagnosis not present

## 2020-06-05 DIAGNOSIS — D2272 Melanocytic nevi of left lower limb, including hip: Secondary | ICD-10-CM | POA: Diagnosis not present

## 2020-06-05 DIAGNOSIS — Z86008 Personal history of in-situ neoplasm of other site: Secondary | ICD-10-CM | POA: Diagnosis not present

## 2020-06-05 DIAGNOSIS — Z808 Family history of malignant neoplasm of other organs or systems: Secondary | ICD-10-CM | POA: Diagnosis not present

## 2020-06-05 DIAGNOSIS — D224 Melanocytic nevi of scalp and neck: Secondary | ICD-10-CM | POA: Diagnosis not present

## 2020-06-05 DIAGNOSIS — D2372 Other benign neoplasm of skin of left lower limb, including hip: Secondary | ICD-10-CM | POA: Diagnosis not present

## 2020-06-05 DIAGNOSIS — L814 Other melanin hyperpigmentation: Secondary | ICD-10-CM | POA: Diagnosis not present

## 2020-07-03 DIAGNOSIS — I10 Essential (primary) hypertension: Secondary | ICD-10-CM | POA: Diagnosis not present

## 2020-07-03 DIAGNOSIS — R200 Anesthesia of skin: Secondary | ICD-10-CM | POA: Diagnosis not present

## 2020-07-03 DIAGNOSIS — F419 Anxiety disorder, unspecified: Secondary | ICD-10-CM | POA: Diagnosis not present

## 2020-07-03 DIAGNOSIS — E782 Mixed hyperlipidemia: Secondary | ICD-10-CM | POA: Diagnosis not present

## 2020-10-20 DIAGNOSIS — L821 Other seborrheic keratosis: Secondary | ICD-10-CM | POA: Diagnosis not present

## 2020-10-20 DIAGNOSIS — L578 Other skin changes due to chronic exposure to nonionizing radiation: Secondary | ICD-10-CM | POA: Diagnosis not present

## 2020-10-20 DIAGNOSIS — L570 Actinic keratosis: Secondary | ICD-10-CM | POA: Diagnosis not present

## 2021-01-10 DIAGNOSIS — R0981 Nasal congestion: Secondary | ICD-10-CM | POA: Diagnosis not present

## 2021-01-10 DIAGNOSIS — R059 Cough, unspecified: Secondary | ICD-10-CM | POA: Diagnosis not present

## 2021-02-02 ENCOUNTER — Other Ambulatory Visit: Payer: Self-pay | Admitting: Family Medicine

## 2021-02-02 DIAGNOSIS — Z1231 Encounter for screening mammogram for malignant neoplasm of breast: Secondary | ICD-10-CM

## 2021-02-11 DIAGNOSIS — L578 Other skin changes due to chronic exposure to nonionizing radiation: Secondary | ICD-10-CM | POA: Diagnosis not present

## 2021-02-11 DIAGNOSIS — Z808 Family history of malignant neoplasm of other organs or systems: Secondary | ICD-10-CM | POA: Diagnosis not present

## 2021-02-11 DIAGNOSIS — D225 Melanocytic nevi of trunk: Secondary | ICD-10-CM | POA: Diagnosis not present

## 2021-02-11 DIAGNOSIS — L814 Other melanin hyperpigmentation: Secondary | ICD-10-CM | POA: Diagnosis not present

## 2021-02-11 DIAGNOSIS — D224 Melanocytic nevi of scalp and neck: Secondary | ICD-10-CM | POA: Diagnosis not present

## 2021-02-11 DIAGNOSIS — D2272 Melanocytic nevi of left lower limb, including hip: Secondary | ICD-10-CM | POA: Diagnosis not present

## 2021-02-11 DIAGNOSIS — D2372 Other benign neoplasm of skin of left lower limb, including hip: Secondary | ICD-10-CM | POA: Diagnosis not present

## 2021-02-11 DIAGNOSIS — L821 Other seborrheic keratosis: Secondary | ICD-10-CM | POA: Diagnosis not present

## 2021-02-11 DIAGNOSIS — B353 Tinea pedis: Secondary | ICD-10-CM | POA: Diagnosis not present

## 2021-02-11 DIAGNOSIS — Z86018 Personal history of other benign neoplasm: Secondary | ICD-10-CM | POA: Diagnosis not present

## 2021-02-11 DIAGNOSIS — Z86008 Personal history of in-situ neoplasm of other site: Secondary | ICD-10-CM | POA: Diagnosis not present

## 2021-02-15 DIAGNOSIS — F419 Anxiety disorder, unspecified: Secondary | ICD-10-CM | POA: Diagnosis not present

## 2021-02-15 DIAGNOSIS — I10 Essential (primary) hypertension: Secondary | ICD-10-CM | POA: Diagnosis not present

## 2021-02-15 DIAGNOSIS — E782 Mixed hyperlipidemia: Secondary | ICD-10-CM | POA: Diagnosis not present

## 2021-02-22 DIAGNOSIS — R7309 Other abnormal glucose: Secondary | ICD-10-CM | POA: Diagnosis not present

## 2021-03-24 ENCOUNTER — Ambulatory Visit: Payer: PPO

## 2021-05-21 ENCOUNTER — Other Ambulatory Visit: Payer: Self-pay

## 2021-05-21 ENCOUNTER — Ambulatory Visit
Admission: RE | Admit: 2021-05-21 | Discharge: 2021-05-21 | Disposition: A | Discharge status: Home / Self Care | Payer: PPO | Source: Ambulatory Visit | Attending: Family Medicine | Admitting: Family Medicine

## 2021-05-21 DIAGNOSIS — Z1231 Encounter for screening mammogram for malignant neoplasm of breast: Secondary | ICD-10-CM

## 2021-06-03 DIAGNOSIS — R7309 Other abnormal glucose: Secondary | ICD-10-CM | POA: Diagnosis not present

## 2021-08-10 DIAGNOSIS — B351 Tinea unguium: Secondary | ICD-10-CM | POA: Diagnosis not present

## 2021-08-10 DIAGNOSIS — E782 Mixed hyperlipidemia: Secondary | ICD-10-CM | POA: Diagnosis not present

## 2021-08-10 DIAGNOSIS — Z0000 Encounter for general adult medical examination without abnormal findings: Secondary | ICD-10-CM | POA: Diagnosis not present

## 2021-08-10 DIAGNOSIS — Z7185 Encounter for immunization safety counseling: Secondary | ICD-10-CM | POA: Diagnosis not present

## 2021-08-10 DIAGNOSIS — R7309 Other abnormal glucose: Secondary | ICD-10-CM | POA: Diagnosis not present

## 2021-08-10 DIAGNOSIS — I10 Essential (primary) hypertension: Secondary | ICD-10-CM | POA: Diagnosis not present

## 2021-08-10 DIAGNOSIS — S90211A Contusion of right great toe with damage to nail, initial encounter: Secondary | ICD-10-CM | POA: Diagnosis not present

## 2021-08-10 DIAGNOSIS — Z8249 Family history of ischemic heart disease and other diseases of the circulatory system: Secondary | ICD-10-CM | POA: Diagnosis not present

## 2021-08-10 DIAGNOSIS — Z842 Family history of other diseases of the genitourinary system: Secondary | ICD-10-CM | POA: Diagnosis not present

## 2021-08-10 DIAGNOSIS — Z23 Encounter for immunization: Secondary | ICD-10-CM | POA: Diagnosis not present

## 2021-10-07 DIAGNOSIS — T148XXA Other injury of unspecified body region, initial encounter: Secondary | ICD-10-CM | POA: Diagnosis not present

## 2021-10-07 DIAGNOSIS — R1900 Intra-abdominal and pelvic swelling, mass and lump, unspecified site: Secondary | ICD-10-CM | POA: Diagnosis not present

## 2021-10-07 DIAGNOSIS — N899 Noninflammatory disorder of vagina, unspecified: Secondary | ICD-10-CM | POA: Diagnosis not present

## 2021-10-07 DIAGNOSIS — N9489 Other specified conditions associated with female genital organs and menstrual cycle: Secondary | ICD-10-CM | POA: Diagnosis not present

## 2021-10-07 DIAGNOSIS — R102 Pelvic and perineal pain: Secondary | ICD-10-CM | POA: Diagnosis not present

## 2021-10-08 DIAGNOSIS — Z90710 Acquired absence of both cervix and uterus: Secondary | ICD-10-CM | POA: Diagnosis not present

## 2021-10-08 DIAGNOSIS — R102 Pelvic and perineal pain: Secondary | ICD-10-CM | POA: Diagnosis not present

## 2021-10-08 DIAGNOSIS — Z8739 Personal history of other diseases of the musculoskeletal system and connective tissue: Secondary | ICD-10-CM | POA: Diagnosis not present

## 2021-10-08 DIAGNOSIS — N909 Noninflammatory disorder of vulva and perineum, unspecified: Secondary | ICD-10-CM | POA: Diagnosis not present

## 2021-10-08 DIAGNOSIS — R1909 Other intra-abdominal and pelvic swelling, mass and lump: Secondary | ICD-10-CM | POA: Diagnosis not present

## 2022-01-18 ENCOUNTER — Ambulatory Visit: Payer: PPO | Admitting: Gastroenterology

## 2022-01-18 ENCOUNTER — Encounter: Payer: Self-pay | Admitting: Gastroenterology

## 2022-01-18 VITALS — BP 152/84 | HR 62 | Wt 191.4 lb

## 2022-01-18 DIAGNOSIS — R152 Fecal urgency: Secondary | ICD-10-CM

## 2022-01-18 DIAGNOSIS — R197 Diarrhea, unspecified: Secondary | ICD-10-CM | POA: Diagnosis not present

## 2022-01-18 MED ORDER — DICYCLOMINE HCL 10 MG PO CAPS: 10.0000 mg | ORAL_CAPSULE | Freq: Three times a day (TID) | ORAL | 11 refills | Status: AC

## 2022-01-18 NOTE — Patient Instructions (Signed)
We have sent the following medications to your pharmacy for you to pick up at your convenience: dicyclomine.  ? ?You can take over the counter Imodium twice daily as needed.  ? ?Call if your symptoms are not better.  ? ?The [facility] providers would like to encourage you to use [facility] to communicate with providers for non-urgent requests or questions.  Due to long hold times on the telephone, sending your provider a message by [facility] may be a faster and more efficient way to get a response.  Please allow 48 business hours for a response.  Please remember that this is for non-urgent requests.  ? ?Thank you for choosing me and [facility]. ? ?Malcolm T. Dagoberto Ligas., MD., Marval Regal ? ? ?

## 2022-01-18 NOTE — Progress Notes (Signed)
? ? ?History of Present Illness: This is a 71 year old female referred by [place], [place], * PA-C for the evaluation of diarrhea, fecal urgency.  She relates infrequent episodes of urgent loose watery diarrhea for several months.  She describes the diarrhea episodes as explosive.  Her typical bowel pattern is 3-4 formed bowel movements daily generally occurring shortly after a meal.  She notes that occasionally salads bring on diarrhea but at other times she tolerates salads without diarrhea.  She has not noted any other foods that cause problems.  She was started on metformin 2 weeks ago with no change in this pattern. Denies weight loss, abdominal pain, constipation, change in stool caliber, melena, hematochezia, nausea, vomiting, dysphagia, reflux symptoms, chest pain. ? ?Colonoscopy 10/2018 ?- The perianal and digital rectal examinations were normal. ?- Multiple medium-mouthed diverticula were found in the entire colon. More concentrated in the left colon where there was evidence of diverticular spasm. ?- The exam was otherwise without abnormality on direct and retroflexion views. ? ? ?No Known Allergies ?Outpatient Medications Prior to Visit  ?Medication Sig Dispense Refill  ? atenolol (TENORMIN) 50 MG tablet Take 50 mg by mouth daily.    ? escitalopram (LEXAPRO) 10 MG tablet Take 10 mg by mouth daily.    ? fenofibrate 160 MG tablet Take 160 mg by mouth daily.    ? metFORMIN (GLUCOPHAGE-XR) 500 MG 24 hr tablet Take 500 mg by mouth at bedtime.    ? OVER THE COUNTER MEDICATION Fish Oil 1000 mg, one capsule daily.    ? OVER THE COUNTER MEDICATION Calcium 600 mg, one capsule daily.    ? rosuvastatin (CRESTOR) 10 MG tablet Take 10 mg by mouth daily.    ? aspirin 81 MG chewable tablet Chew by mouth daily.    ? ?No facility-administered medications prior to visit.  ? ?Past Medical History:  ?Diagnosis Date  ? Anxiety   ? Cancer [facility])   ? Melanoma on left leg.  ? High cholesterol   ? Hypertension   ? ?Past  Surgical History:  ?Procedure Laterality Date  ? ABDOMINAL HYSTERECTOMY    ? TRIGGER FINGER RELEASE    ? ?Social History  ? ?Socioeconomic History  ? Marital status: Married  ?  Spouse name: Not on file  ? Number of children: Not on file  ? Years of education: Not on file  ? Highest education level: Not on file  ?Occupational History  ? Not on file  ?Tobacco Use  ? Smoking status: Never  ? Smokeless tobacco: Never  ?Vaping Use  ? Vaping Use: Never used  ?Substance and Sexual Activity  ? Alcohol use: Yes  ?  Comment: occ  ? Drug use: No  ? Sexual activity: Not on file  ?Other Topics Concern  ? Not on file  ?Social History Narrative  ? Not on file  ? ?Social Determinants of Health  ? ?Financial Resource Strain: Not on file  ?Food Insecurity: Not on file  ?Transportation Needs: Not on file  ?Physical Activity: Not on file  ?Stress: Not on file  ?Social Connections: Not on file  ? ?Family History  ?Problem Relation Age of Onset  ? Colon cancer Neg Hx   ? Esophageal cancer Neg Hx   ? Rectal cancer Neg Hx   ? Stomach cancer Neg Hx   ? ?   ? ?Review of Systems: Pertinent positive and negative review of systems were noted in the above HPI section. All other review of  systems were otherwise negative. ? ? ?Physical Exam: ?General: Well developed, well nourished, no acute distress ?Head: Normocephalic and atraumatic ?Eyes: Sclerae anicteric, EOMI ?Ears: Normal auditory acuity ?Mouth: Not examined, mask on during Covid-19 pandemic ?Neck: Supple, no masses or thyromegaly ?Lungs: Clear throughout to auscultation ?Heart: Regular rate and rhythm; no murmurs, rubs or bruits ?Abdomen: Soft, non tender and non distended. No masses, hepatosplenomegaly or hernias noted. Normal Bowel sounds ?Rectal: Not done ?Musculoskeletal: Symmetrical with no gross deformities  ?Skin: No lesions on visible extremities ?Pulses:  Normal pulses noted ?Extremities: No clubbing, cyanosis, edema or deformities noted ?Neurological: Alert oriented x 4,  grossly nonfocal ?Cervical Nodes:  No significant cervical adenopathy ?Inguinal Nodes: No significant inguinal adenopathy ?Psychological:  Alert and cooperative. Normal mood and affect ? ? ?Assessment and Recommendations: ? ?Intermittent diarrhea, fecal urgency.  Advised to begin a lactose-free diet for 7 days and if diarrhea does not improve can resume lactose products.  No more than 2 caffeinated beverages daily.  Trial of dicyclomine 10 mg p.o. 3 times daily AC as needed and Imodium p.o. twice daily as needed.  If diarrhea persists or worsens will provide a low FODMAP diet and if diarrhea still persists consider fecal calprotectin and colonoscopy.  She is advised to contact us if her diarrhea is not well controlled with the above measures. ? ? ?cc: Elisabeth Cara, PA-C ?[address] ?[address] ?[place],  [place] [address] ?

## 2022-07-04 ENCOUNTER — Ambulatory Visit
Admission: RE | Admit: 2022-07-04 | Discharge: 2022-07-04 | Disposition: A | Discharge status: Home / Self Care | Payer: PPO | Source: Ambulatory Visit | Attending: Family Medicine | Admitting: Family Medicine

## 2022-07-04 ENCOUNTER — Other Ambulatory Visit: Payer: Self-pay | Admitting: Family Medicine

## 2022-07-04 DIAGNOSIS — Z1231 Encounter for screening mammogram for malignant neoplasm of breast: Secondary | ICD-10-CM

## 2023-08-17 ENCOUNTER — Other Ambulatory Visit: Payer: Self-pay | Admitting: Family Medicine

## 2023-08-17 DIAGNOSIS — Z1231 Encounter for screening mammogram for malignant neoplasm of breast: Secondary | ICD-10-CM

## 2023-08-22 ENCOUNTER — Ambulatory Visit: Payer: PPO

## 2023-09-20 ENCOUNTER — Ambulatory Visit
Admission: RE | Admit: 2023-09-20 | Discharge: 2023-09-20 | Disposition: A | Discharge status: Home / Self Care | Payer: PPO | Source: Ambulatory Visit | Attending: Family Medicine | Admitting: Family Medicine

## 2023-09-20 DIAGNOSIS — Z1231 Encounter for screening mammogram for malignant neoplasm of breast: Secondary | ICD-10-CM

## 2024-08-30 ENCOUNTER — Other Ambulatory Visit: Payer: Self-pay | Admitting: Family Medicine

## 2024-08-30 DIAGNOSIS — Z1231 Encounter for screening mammogram for malignant neoplasm of breast: Secondary | ICD-10-CM

## 2024-09-20 ENCOUNTER — Ambulatory Visit
Admission: RE | Admit: 2024-09-20 | Discharge: 2024-09-20 | Disposition: A | Discharge status: Home / Self Care | Source: Ambulatory Visit | Attending: Family Medicine | Admitting: Family Medicine

## 2024-09-20 DIAGNOSIS — Z1231 Encounter for screening mammogram for malignant neoplasm of breast: Secondary | ICD-10-CM
